# Patient Record
Sex: Female | Born: 1955 | Race: White | Hispanic: No | Smoking: Never smoker
Health system: Southern US, Community
[De-identification: ages and names within clinical notes are randomized; demographics above are authoritative.]

## PROBLEM LIST (undated history)

## (undated) DIAGNOSIS — I10 Essential (primary) hypertension: Secondary | ICD-10-CM

## (undated) DIAGNOSIS — E039 Hypothyroidism, unspecified: Secondary | ICD-10-CM

## (undated) DIAGNOSIS — E785 Hyperlipidemia, unspecified: Secondary | ICD-10-CM

## (undated) DIAGNOSIS — T753XXA Motion sickness, initial encounter: Secondary | ICD-10-CM

## (undated) HISTORY — PX: ANKLE SURGERY: SHX546

---

## 2020-10-21 ENCOUNTER — Other Ambulatory Visit: Payer: Self-pay | Admitting: Internal Medicine

## 2020-10-21 DIAGNOSIS — Z1231 Encounter for screening mammogram for malignant neoplasm of breast: Secondary | ICD-10-CM

## 2020-12-01 ENCOUNTER — Other Ambulatory Visit: Payer: Self-pay

## 2020-12-01 ENCOUNTER — Ambulatory Visit
Admission: RE | Admit: 2020-12-01 | Discharge: 2020-12-01 | Disposition: A | Discharge status: Home / Self Care | Payer: Medicare HMO | Source: Ambulatory Visit | Attending: Internal Medicine | Admitting: Internal Medicine

## 2020-12-01 DIAGNOSIS — Z1231 Encounter for screening mammogram for malignant neoplasm of breast: Secondary | ICD-10-CM | POA: Diagnosis present

## 2021-07-26 ENCOUNTER — Ambulatory Visit: Admit: 2021-07-26 | Payer: Medicare HMO | Admitting: Podiatry

## 2021-07-26 SURGERY — BUNIONECTOMY
Anesthesia: Choice | Site: Toe | Laterality: Left

## 2021-11-21 DIAGNOSIS — M2012 Hallux valgus (acquired), left foot: Secondary | ICD-10-CM | POA: Diagnosis not present

## 2021-11-21 DIAGNOSIS — M2042 Other hammer toe(s) (acquired), left foot: Secondary | ICD-10-CM | POA: Diagnosis not present

## 2021-12-20 ENCOUNTER — Encounter: Payer: Self-pay | Admitting: Podiatry

## 2021-12-26 ENCOUNTER — Other Ambulatory Visit: Payer: Self-pay | Admitting: Podiatry

## 2021-12-29 NOTE — Discharge Instructions (Signed)
[facility]  POST OPERATIVE INSTRUCTIONS FOR DR. FOWLER AND [facility]   Take your medication as prescribed.  Pain medication should be taken only as needed.  Keep the dressing clean, dry and intact.  Keep your foot elevated above the heart level for the first 48 hours.  Walking to the bathroom and brief periods of walking are acceptable, unless we have instructed you to be non-weight bearing.  Always wear your post-op shoe when walking.  Always use your crutches if you are to be non-weight bearing.  Do not take a shower. Baths are permissible as long as the foot is kept out of the water.   Every hour you are awake:  Bend your knee 15 times. Flex foot 15 times Massage calf 15 times  Call [facility] ([phone]) if any of the following problems occur: You develop a temperature or fever. The bandage becomes saturated with blood. Medication does not stop your pain. Injury of the foot occurs. Any symptoms of infection including redness, odor, or red streaks running from wound. 

## 2022-01-03 ENCOUNTER — Ambulatory Visit
Admission: RE | Admit: 2022-01-03 | Discharge: 2022-01-03 | Disposition: A | Discharge status: Home / Self Care | Payer: Medicare HMO | Attending: Podiatry | Admitting: Podiatry

## 2022-01-03 ENCOUNTER — Encounter
Admission: RE | Disposition: A | Discharge status: Home / Self Care | Payer: Self-pay | Source: Home / Self Care | Attending: Podiatry

## 2022-01-03 ENCOUNTER — Ambulatory Visit: Payer: Medicare HMO | Admitting: Anesthesiology

## 2022-01-03 ENCOUNTER — Other Ambulatory Visit: Payer: Self-pay

## 2022-01-03 ENCOUNTER — Ambulatory Visit: Payer: Self-pay

## 2022-01-03 ENCOUNTER — Encounter: Payer: Self-pay | Admitting: Podiatry

## 2022-01-03 DIAGNOSIS — E039 Hypothyroidism, unspecified: Secondary | ICD-10-CM | POA: Diagnosis not present

## 2022-01-03 DIAGNOSIS — M205X2 Other deformities of toe(s) (acquired), left foot: Secondary | ICD-10-CM | POA: Insufficient documentation

## 2022-01-03 DIAGNOSIS — M2042 Other hammer toe(s) (acquired), left foot: Secondary | ICD-10-CM | POA: Insufficient documentation

## 2022-01-03 DIAGNOSIS — M2012 Hallux valgus (acquired), left foot: Secondary | ICD-10-CM | POA: Diagnosis present

## 2022-01-03 DIAGNOSIS — I10 Essential (primary) hypertension: Secondary | ICD-10-CM | POA: Diagnosis not present

## 2022-01-03 HISTORY — DX: Hypothyroidism, unspecified: E03.9

## 2022-01-03 HISTORY — PX: BUNIONECTOMY: SHX129

## 2022-01-03 HISTORY — DX: Hyperlipidemia, unspecified: E78.5

## 2022-01-03 HISTORY — DX: Motion sickness, initial encounter: T75.3XXA

## 2022-01-03 HISTORY — PX: HAMMER TOE SURGERY: SHX385

## 2022-01-03 HISTORY — DX: Essential (primary) hypertension: I10

## 2022-01-03 SURGERY — BUNIONECTOMY
Anesthesia: General | Site: Toe | Laterality: Left

## 2022-01-03 MED ORDER — OXYCODONE HCL 5 MG PO TABS: 5.0000 mg | ORAL_TABLET | Freq: Once | ORAL | Status: DC | PRN

## 2022-01-03 MED ORDER — OXYCODONE-ACETAMINOPHEN 5-325 MG PO TABS: 1.0000 | ORAL_TABLET | Freq: Four times a day (QID) | ORAL | 0 refills | Status: AC | PRN

## 2022-01-03 MED ORDER — DEXAMETHASONE SODIUM PHOSPHATE 4 MG/ML IJ SOLN
INTRAMUSCULAR | Status: DC | PRN
  Administered 2022-01-03: 4 mg via INTRAVENOUS

## 2022-01-03 MED ORDER — ACETAMINOPHEN 160 MG/5ML PO SOLN: 325.0000 mg | ORAL | Status: DC | PRN

## 2022-01-03 MED ORDER — MIDAZOLAM HCL 5 MG/5ML IJ SOLN
INTRAMUSCULAR | Status: DC | PRN
  Administered 2022-01-03: 1 mg via INTRAVENOUS

## 2022-01-03 MED ORDER — LIDOCAINE HCL (CARDIAC) PF 100 MG/5ML IV SOSY
PREFILLED_SYRINGE | INTRAVENOUS | Status: DC | PRN
  Administered 2022-01-03: 40 mg via INTRATRACHEAL

## 2022-01-03 MED ORDER — ONDANSETRON HCL 4 MG/2ML IJ SOLN
4.0000 mg | Freq: Once | INTRAMUSCULAR | Status: AC
  Administered 2022-01-03: 4 mg via INTRAVENOUS

## 2022-01-03 MED ORDER — ONDANSETRON HCL 4 MG/2ML IJ SOLN
INTRAMUSCULAR | Status: DC | PRN
  Administered 2022-01-03: 4 mg via INTRAVENOUS

## 2022-01-03 MED ORDER — ACETAMINOPHEN 325 MG PO TABS: 325.0000 mg | ORAL_TABLET | ORAL | Status: DC | PRN

## 2022-01-03 MED ORDER — SCOPOLAMINE 1 MG/3DAYS TD PT72
1.0000 | MEDICATED_PATCH | TRANSDERMAL | Status: DC
  Administered 2022-01-03: 1.5 mg via TRANSDERMAL

## 2022-01-03 MED ORDER — EPHEDRINE SULFATE 50 MG/ML IJ SOLN
INTRAMUSCULAR | Status: DC | PRN
  Administered 2022-01-03 (×2): 5 mg via INTRAVENOUS
  Administered 2022-01-03: 10 mg via INTRAVENOUS
  Administered 2022-01-03 (×2): 5 mg via INTRAVENOUS

## 2022-01-03 MED ORDER — ONDANSETRON HCL 4 MG PO TABS: 4.0000 mg | ORAL_TABLET | Freq: Every day | ORAL | 1 refills | Status: AC | PRN

## 2022-01-03 MED ORDER — LACTATED RINGERS IV SOLN: INTRAVENOUS | Status: DC

## 2022-01-03 MED ORDER — PROPOFOL 10 MG/ML IV BOLUS
INTRAVENOUS | Status: DC | PRN
  Administered 2022-01-03: 110 mg via INTRAVENOUS

## 2022-01-03 MED ORDER — BUPIVACAINE HCL (PF) 0.25 % IJ SOLN
INTRAMUSCULAR | Status: DC | PRN
  Administered 2022-01-03: 10 mL

## 2022-01-03 MED ORDER — FENTANYL CITRATE (PF) 100 MCG/2ML IJ SOLN
INTRAMUSCULAR | Status: DC | PRN
Start: 2022-01-03 — End: 2022-01-03
  Administered 2022-01-03: 50 ug via INTRAVENOUS

## 2022-01-03 MED ORDER — CEFAZOLIN SODIUM-DEXTROSE 2-4 GM/100ML-% IV SOLN
2.0000 g | INTRAVENOUS | Status: AC
  Administered 2022-01-03: 2 g via INTRAVENOUS

## 2022-01-03 MED ORDER — SODIUM CHLORIDE 0.9 % IR SOLN
Status: DC | PRN
  Administered 2022-01-03: 500 mL

## 2022-01-03 MED ORDER — BUPIVACAINE LIPOSOME 1.3 % IJ SUSP
INTRAMUSCULAR | Status: DC | PRN
  Administered 2022-01-03: 10 mL

## 2022-01-03 MED ORDER — OXYCODONE HCL 5 MG/5ML PO SOLN: 5.0000 mg | Freq: Once | ORAL | Status: DC | PRN

## 2022-01-03 MED ORDER — FENTANYL CITRATE PF 50 MCG/ML IJ SOSY: 25.0000 ug | PREFILLED_SYRINGE | INTRAMUSCULAR | Status: DC | PRN

## 2022-01-03 MED ORDER — GLYCOPYRROLATE 0.2 MG/ML IJ SOLN
INTRAMUSCULAR | Status: DC | PRN
  Administered 2022-01-03: .1 mg via INTRAVENOUS

## 2022-01-03 SURGICAL SUPPLY — 49 items
APL SKNCLS STERI-STRIP NONHPOA (GAUZE/BANDAGES/DRESSINGS) ×2
BENZOIN TINCTURE PRP APPL 2/3 (GAUZE/BANDAGES/DRESSINGS) ×3 IMPLANT
BLADE OSC/SAGITTAL MD 5.5X18 (BLADE) ×2 IMPLANT
BLADE SAW LAPIPLASTY 40X11 (BLADE) ×2 IMPLANT
BNDG CMPR 75X41 PLY HI ABS (GAUZE/BANDAGES/DRESSINGS) ×2
BNDG COHESIVE 4X5 TAN ST LF (GAUZE/BANDAGES/DRESSINGS) ×3 IMPLANT
BNDG ELASTIC 4X5.8 VLCR STR LF (GAUZE/BANDAGES/DRESSINGS) ×3 IMPLANT
BNDG ESMARK 4X12 TAN STRL LF (GAUZE/BANDAGES/DRESSINGS) ×3 IMPLANT
BNDG GAUZE ELAST 4 BULKY (GAUZE/BANDAGES/DRESSINGS) ×3 IMPLANT
BNDG STRETCH 4X75 STRL LF (GAUZE/BANDAGES/DRESSINGS) ×3 IMPLANT
BOOT STEPPER DURA SM (SOFTGOODS) ×2 IMPLANT
CANISTER SUCT 1200ML W/VALVE (MISCELLANEOUS) ×3 IMPLANT
COVER LIGHT HANDLE UNIVERSAL (MISCELLANEOUS) ×6 IMPLANT
CUFF TOURN SGL QUICK 18X4 (TOURNIQUET CUFF) ×2 IMPLANT
DRAPE FLUOR MINI C-ARM 54X84 (DRAPES) ×3 IMPLANT
DURAPREP 26ML APPLICATOR (WOUND CARE) ×3 IMPLANT
ELECT REM PT RETURN 9FT ADLT (ELECTROSURGICAL) ×3
ELECTRODE REM PT RTRN 9FT ADLT (ELECTROSURGICAL) ×2 IMPLANT
FIXATION HAMMERTOE MED 20MM 0D (Toe) ×1 IMPLANT
GAUZE SPONGE 4X4 12PLY STRL (GAUZE/BANDAGES/DRESSINGS) ×3 IMPLANT
GAUZE XEROFORM 1X8 LF (GAUZE/BANDAGES/DRESSINGS) ×3 IMPLANT
GLOVE SRG 8 PF TXTR STRL LF DI (GLOVE) ×4 IMPLANT
GLOVE SURG ENC MOIS LTX SZ7.5 (GLOVE) ×6 IMPLANT
GLOVE SURG UNDER POLY LF SZ8 (GLOVE) ×6
GOWN STRL REUS W/ TWL LRG LVL3 (GOWN DISPOSABLE) ×4 IMPLANT
GOWN STRL REUS W/TWL LRG LVL3 (GOWN DISPOSABLE) ×6
HAMMERTOE MED 20MM 0D (Toe) ×2 IMPLANT
HAMMERTOE MEDIUM 20MM (Toe) ×1 IMPLANT
KIT DRILL HAMMERLOCK2 IMPLANT (BIT) ×2 IMPLANT
KIT TURNOVER KIT A (KITS) ×3 IMPLANT
LAPIPLASTY SYS 4A (Orthopedic Implant) ×3 IMPLANT
NS IRRIG 500ML POUR BTL (IV SOLUTION) ×3 IMPLANT
PACK EXTREMITY ARMC (MISCELLANEOUS) ×3 IMPLANT
PENCIL SMOKE EVACUATOR (MISCELLANEOUS) ×3 IMPLANT
PIN BALLS 3/8 F/.045 WIRE (MISCELLANEOUS) ×3 IMPLANT
RASP SM TEAR CROSS CUT (RASP) ×2 IMPLANT
SCREW 2.7 HIGH PITCH LOCKING (Screw) ×2 IMPLANT
SCREW HIGH PITCH LOCK 2.7 (Screw) ×2 IMPLANT
STOCKINETTE IMPERVIOUS LG (DRAPES) ×3 IMPLANT
STRIP CLOSURE SKIN 1/4X4 (GAUZE/BANDAGES/DRESSINGS) ×3 IMPLANT
SUT ETHILON 3-0 FS-10 30 BLK (SUTURE) ×3
SUT MNCRL 4-0 (SUTURE) ×3
SUT MNCRL 4-0 27XMFL (SUTURE) ×2
SUT VIC AB 3-0 SH 27 (SUTURE) ×3
SUT VIC AB 3-0 SH 27X BRD (SUTURE) ×1 IMPLANT
SUT VIC AB 4-0 FS2 27 (SUTURE) ×2 IMPLANT
SUTURE EHLN 3-0 FS-10 30 BLK (SUTURE) ×1 IMPLANT
SUTURE MNCRL 4-0 27XMF (SUTURE) ×1 IMPLANT
SYSTEM LAPIPLASTY 4A (Orthopedic Implant) ×1 IMPLANT

## 2022-01-03 NOTE — H&P (Signed)
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  The various methods of treatment have been discussed with the patient.  No guarantees were given.  After consideration of risks, benefits and other options for treatment, the patient has consented to surgery.  I have reviewed the patients chart and labs.     A history and physical examination was performed in my office.  The patient was reexamined.  There have been no changes to this history and physical examination.  Full H & P performed in pre op area.  Samara Deist A

## 2022-01-03 NOTE — Anesthesia Procedure Notes (Signed)
Procedure Name: LMA Insertion Date/Time: 01/03/2022 11:13 AM Performed by: Mayme Genta, CRNA Pre-anesthesia Checklist: Patient identified, Emergency Drugs available, Suction available, Timeout performed and Patient being monitored Patient Re-evaluated:Patient Re-evaluated prior to induction Oxygen Delivery Method: Circle system utilized Preoxygenation: Pre-oxygenation with 100% oxygen Induction Type: IV induction LMA: LMA inserted LMA Size: 4.0 Number of attempts: 1 Placement Confirmation: positive ETCO2 and breath sounds checked- equal and bilateral Tube secured with: Tape

## 2022-01-03 NOTE — Anesthesia Preprocedure Evaluation (Signed)
Anesthesia Evaluation  Patient identified by MRN, date of birth, ID band Patient awake    History of Anesthesia Complications (+) PONVNegative for: history of anesthetic complications  Airway Mallampati: II  TM Distance: >3 FB Neck ROM: Full    Dental no notable dental hx.    Pulmonary neg pulmonary ROS,    Pulmonary exam normal        Cardiovascular Exercise Tolerance: Good hypertension, Pt. on medications Normal cardiovascular exam     Neuro/Psych negative neurological ROS  negative psych ROS   GI/Hepatic negative GI ROS, Neg liver ROS,   Endo/Other  Hypothyroidism   Renal/GU negative Renal ROS     Musculoskeletal negative musculoskeletal ROS (+)   Abdominal   Peds  Hematology negative hematology ROS (+)   Anesthesia Other Findings   Reproductive/Obstetrics                             Anesthesia Physical Anesthesia Plan  ASA: 2  Anesthesia Plan: General   Post-op Pain Management: Toradol IV (intra-op) and Ofirmev IV (intra-op)   Induction:   PONV Risk Score and Plan: 4 or greater and Ondansetron, Dexamethasone, Midazolam, Treatment may vary due to age or medical condition and Scopolamine patch - Pre-op  Airway Management Planned: LMA  Additional Equipment: None  Intra-op Plan:   Post-operative Plan: Extubation in OR  Informed Consent: I have reviewed the patients History and Physical, chart, labs and discussed the procedure including the risks, benefits and alternatives for the proposed anesthesia with the patient or authorized representative who has indicated his/her understanding and acceptance.       Plan Discussed with: CRNA  Anesthesia Plan Comments:         Anesthesia Quick Evaluation

## 2022-01-03 NOTE — Transfer of Care (Signed)
Immediate Anesthesia Transfer of Care Note  Patient: Jillian Silva  Procedure(s) Performed: BUNIONECTOMY - LAPIDUS TYPE (Left: Toe) HAMMER TOE CORRECTION - 2ND (Left: Toe)  Patient Location: PACU  Anesthesia Type: General  Level of Consciousness: awake, alert  and patient cooperative  Airway and Oxygen Therapy: Patient Spontanous Breathing and Patient connected to supplemental oxygen  Post-op Assessment: Post-op Vital signs reviewed, Patient's Cardiovascular Status Stable, Respiratory Function Stable, Patent Airway and No signs of Nausea or vomiting  Post-op Vital Signs: Reviewed and stable  Complications: No notable events documented.

## 2022-01-03 NOTE — Anesthesia Postprocedure Evaluation (Signed)
Anesthesia Post Note  Patient: Jillian Silva  Procedure(s) Performed: Lillard Anes - LAPIDUS TYPE (Left: Toe) HAMMER TOE CORRECTION - 2ND (Left: Toe)     Patient location during evaluation: PACU Anesthesia Type: General Level of consciousness: awake and alert Pain management: pain level controlled Vital Signs Assessment: post-procedure vital signs reviewed and stable Respiratory status: spontaneous breathing, nonlabored ventilation, respiratory function stable and patient connected to nasal cannula oxygen Cardiovascular status: blood pressure returned to baseline and stable Postop Assessment: no apparent nausea or vomiting Anesthetic complications: no   No notable events documented.  Adele Barthel Denni France

## 2022-01-03 NOTE — Op Note (Signed)
Operative note   Surgeon:Justin Lawyer: None    Preop diagnosis: 1)Hallux valgus deformity left foot 2) hammertoe contracture left second toe    Postop diagnosis: Same    Procedure: Lapidus hallux valgus correction left foot.  2) hammertoe repair left second toe with PIPJ arthrodesis with hammerlock implant  3) dorsal and medial capsulotomy with plantar plate release second MTPJ left foot    EBL: Minimal    Anesthesia:local and general.  Local consisted of a total of 20 cc of spare long-acting anesthetic and 10 cc of 0.25% bupivacaine plain    Hemostasis: Mid calf tourniquet inflated to 200 mmHg for 90-minute    Specimen: None    Complications: None    Operative indications:Jillian Silva is an 66 y.o. that presents today for surgical intervention.  The risks/benefits/alternatives/complications have been discussed and consent has been given.    Procedure:  Patient was brought into the OR and placed on the operating table in thesupine position. After anesthesia was obtained theleft lower extremity was prepped and draped in usual sterile fashion.  Attention was directed to the dorsal aspect of the foot where a dorsal incision was made at the first met cuneiforms joint.  Sharp and blunt dissection was carried down to the periosteum.  Subperiosteal dissection was then undertaken.  This exposed the first met cuneiform joint.  This was then freed and loosened.  A small fulcrum was placed between the base of the first metatarsal and second metatarsal.  Next the joint positioner was placed on the medial aspect of the metatarsal.  A small stab incision was made at the second metatarsal.  Compression was placed for the positioner.  Good realignment of the first intermetatarsal angle was noted at this time.  Attention was then directed to the dorsomedial first MTPJ where an incision was performed.  Sharp and blunt dissection was carried down to the capsule.  The intermetatarsal space was  then entered.  The conjoined tendon of the abductor was then freed from the base of the proximal phalanx.  Attention was to redirected to the first met cuneiform joint.  At this time the osteotomy cut guide was placed into the joint region.  2 vertical cuts were then made.  The cartilage material was removed from the first met cuneiform joint and the joint was then prepped with a 2.0 mm drill bit.  The joint compressor was then placed.  Good compression and realignment was noted.  Next the medial and dorsal locking plates were then placed from the Conway set.  Realignment and stability was noted in all planes.  Attention was redirected to the dorsomedial first MTPJ.  A small T capsulotomy was performed.  The dorsomedial eminence was noted and transected and smoothed with a power rasp.  Closure was then performed after all areas were irrigated.  3-0 Vicryl for the capsular tissue.  4-0 Vicryl in subcutaneous tissue and a 4-0 Monocryl for the skin.   Attention was then directed to the second toe where a curvilinear incision was made from the second metatarsophalangeal joint to the PIPJ.  Sharp and blunt dissection carried down to the longus tensor tendon.  The extensor tendon was noted transected and reflected proximal.  At this time residual contracture of the MTPJ was noted.  A dorsal and medial capsulotomy was performed.  McGlamry elevator was introduced in the plantar plate release.  At this time the toe did note to sit in a much better straighter position.  The  head of the proximal phalanx was then excised as well as the base of the middle phalanx.  A medium hammerlock straight implant was then placed in the PIPJ for fusion.  Good alignment was noted.  The wound was then flushed with copious amounts of irrigation.  Closure of the extensor tendon was performed with a 4-0 Vicryl as well as the subcutaneous tissue and the skin was closed with a 3-0 nylon.  Further local anesthesia was performed at the  end of the case.    Patient tolerated the procedure and anesthesia well.  Was transported from the OR to the PACU with all vital signs stable and vascular status intact. To be discharged per routine protocol.  Will follow up in approximately 1 week in the outpatient clinic.

## 2022-01-04 ENCOUNTER — Encounter: Payer: Self-pay | Admitting: Podiatry

## 2022-01-26 ENCOUNTER — Other Ambulatory Visit: Payer: Self-pay | Admitting: Internal Medicine

## 2022-01-26 DIAGNOSIS — Z1231 Encounter for screening mammogram for malignant neoplasm of breast: Secondary | ICD-10-CM

## 2022-03-05 ENCOUNTER — Ambulatory Visit
Admission: RE | Admit: 2022-03-05 | Discharge: 2022-03-05 | Disposition: A | Discharge status: Home / Self Care | Payer: Medicare HMO | Source: Ambulatory Visit | Attending: Internal Medicine | Admitting: Internal Medicine

## 2022-03-05 ENCOUNTER — Other Ambulatory Visit: Payer: Self-pay

## 2022-03-05 DIAGNOSIS — Z1231 Encounter for screening mammogram for malignant neoplasm of breast: Secondary | ICD-10-CM | POA: Insufficient documentation

## 2022-04-12 DIAGNOSIS — M2012 Hallux valgus (acquired), left foot: Secondary | ICD-10-CM | POA: Diagnosis not present

## 2022-04-12 DIAGNOSIS — M2042 Other hammer toe(s) (acquired), left foot: Secondary | ICD-10-CM | POA: Diagnosis not present

## 2022-04-25 DIAGNOSIS — L570 Actinic keratosis: Secondary | ICD-10-CM | POA: Diagnosis not present

## 2022-04-25 DIAGNOSIS — L821 Other seborrheic keratosis: Secondary | ICD-10-CM | POA: Diagnosis not present

## 2022-04-25 DIAGNOSIS — L298 Other pruritus: Secondary | ICD-10-CM | POA: Diagnosis not present

## 2022-06-11 DIAGNOSIS — H35341 Macular cyst, hole, or pseudohole, right eye: Secondary | ICD-10-CM | POA: Diagnosis not present

## 2022-06-11 DIAGNOSIS — H5203 Hypermetropia, bilateral: Secondary | ICD-10-CM | POA: Diagnosis not present

## 2022-06-11 DIAGNOSIS — H524 Presbyopia: Secondary | ICD-10-CM | POA: Diagnosis not present

## 2022-06-11 DIAGNOSIS — H52213 Irregular astigmatism, bilateral: Secondary | ICD-10-CM | POA: Diagnosis not present

## 2022-07-19 DIAGNOSIS — L570 Actinic keratosis: Secondary | ICD-10-CM | POA: Diagnosis not present

## 2022-07-19 DIAGNOSIS — Z872 Personal history of diseases of the skin and subcutaneous tissue: Secondary | ICD-10-CM | POA: Diagnosis not present

## 2022-07-19 DIAGNOSIS — L578 Other skin changes due to chronic exposure to nonionizing radiation: Secondary | ICD-10-CM | POA: Diagnosis not present

## 2022-07-19 DIAGNOSIS — L821 Other seborrheic keratosis: Secondary | ICD-10-CM | POA: Diagnosis not present

## 2022-07-19 DIAGNOSIS — L298 Other pruritus: Secondary | ICD-10-CM | POA: Diagnosis not present

## 2022-08-13 DIAGNOSIS — L570 Actinic keratosis: Secondary | ICD-10-CM | POA: Diagnosis not present

## 2022-08-13 DIAGNOSIS — L821 Other seborrheic keratosis: Secondary | ICD-10-CM | POA: Diagnosis not present

## 2022-09-04 DIAGNOSIS — D485 Neoplasm of uncertain behavior of skin: Secondary | ICD-10-CM | POA: Diagnosis not present

## 2022-09-04 DIAGNOSIS — C44722 Squamous cell carcinoma of skin of right lower limb, including hip: Secondary | ICD-10-CM | POA: Diagnosis not present

## 2022-09-19 DIAGNOSIS — I10 Essential (primary) hypertension: Secondary | ICD-10-CM | POA: Diagnosis not present

## 2022-09-19 DIAGNOSIS — F411 Generalized anxiety disorder: Secondary | ICD-10-CM | POA: Diagnosis not present

## 2022-09-19 DIAGNOSIS — E7800 Pure hypercholesterolemia, unspecified: Secondary | ICD-10-CM | POA: Diagnosis not present

## 2022-09-19 DIAGNOSIS — E890 Postprocedural hypothyroidism: Secondary | ICD-10-CM | POA: Diagnosis not present

## 2022-10-01 DIAGNOSIS — C44722 Squamous cell carcinoma of skin of right lower limb, including hip: Secondary | ICD-10-CM | POA: Diagnosis not present

## 2022-10-01 DIAGNOSIS — L309 Dermatitis, unspecified: Secondary | ICD-10-CM | POA: Diagnosis not present

## 2022-10-18 DIAGNOSIS — R059 Cough, unspecified: Secondary | ICD-10-CM | POA: Diagnosis not present

## 2022-10-18 DIAGNOSIS — R070 Pain in throat: Secondary | ICD-10-CM | POA: Diagnosis not present

## 2022-10-18 DIAGNOSIS — Z20822 Contact with and (suspected) exposure to covid-19: Secondary | ICD-10-CM | POA: Diagnosis not present

## 2022-10-18 DIAGNOSIS — J069 Acute upper respiratory infection, unspecified: Secondary | ICD-10-CM | POA: Diagnosis not present

## 2022-10-29 DIAGNOSIS — I10 Essential (primary) hypertension: Secondary | ICD-10-CM | POA: Diagnosis not present

## 2022-10-29 DIAGNOSIS — E890 Postprocedural hypothyroidism: Secondary | ICD-10-CM | POA: Diagnosis not present

## 2022-10-29 DIAGNOSIS — E7800 Pure hypercholesterolemia, unspecified: Secondary | ICD-10-CM | POA: Diagnosis not present

## 2022-10-30 DIAGNOSIS — M2011 Hallux valgus (acquired), right foot: Secondary | ICD-10-CM | POA: Diagnosis not present

## 2022-10-30 DIAGNOSIS — M79671 Pain in right foot: Secondary | ICD-10-CM | POA: Diagnosis not present

## 2022-11-01 DIAGNOSIS — L03032 Cellulitis of left toe: Secondary | ICD-10-CM | POA: Diagnosis not present

## 2022-11-05 DIAGNOSIS — E7800 Pure hypercholesterolemia, unspecified: Secondary | ICD-10-CM | POA: Diagnosis not present

## 2022-11-05 DIAGNOSIS — E039 Hypothyroidism, unspecified: Secondary | ICD-10-CM | POA: Diagnosis not present

## 2022-11-05 DIAGNOSIS — I10 Essential (primary) hypertension: Secondary | ICD-10-CM | POA: Diagnosis not present

## 2022-11-05 DIAGNOSIS — Z0000 Encounter for general adult medical examination without abnormal findings: Secondary | ICD-10-CM | POA: Diagnosis not present

## 2022-11-05 DIAGNOSIS — Z2821 Immunization not carried out because of patient refusal: Secondary | ICD-10-CM | POA: Diagnosis not present

## 2022-11-05 DIAGNOSIS — Z1331 Encounter for screening for depression: Secondary | ICD-10-CM | POA: Diagnosis not present

## 2022-11-15 DIAGNOSIS — L03032 Cellulitis of left toe: Secondary | ICD-10-CM | POA: Diagnosis not present

## 2022-11-19 DIAGNOSIS — C4441 Basal cell carcinoma of skin of scalp and neck: Secondary | ICD-10-CM | POA: Diagnosis not present

## 2022-11-19 DIAGNOSIS — L570 Actinic keratosis: Secondary | ICD-10-CM | POA: Diagnosis not present

## 2022-11-19 DIAGNOSIS — D485 Neoplasm of uncertain behavior of skin: Secondary | ICD-10-CM | POA: Diagnosis not present

## 2022-12-31 DIAGNOSIS — C4491 Basal cell carcinoma of skin, unspecified: Secondary | ICD-10-CM | POA: Diagnosis not present

## 2022-12-31 DIAGNOSIS — C44722 Squamous cell carcinoma of skin of right lower limb, including hip: Secondary | ICD-10-CM | POA: Diagnosis not present

## 2022-12-31 DIAGNOSIS — L988 Other specified disorders of the skin and subcutaneous tissue: Secondary | ICD-10-CM | POA: Diagnosis not present

## 2023-01-07 DIAGNOSIS — Z1211 Encounter for screening for malignant neoplasm of colon: Secondary | ICD-10-CM | POA: Diagnosis not present

## 2023-01-07 DIAGNOSIS — K640 First degree hemorrhoids: Secondary | ICD-10-CM | POA: Diagnosis not present

## 2023-01-14 DIAGNOSIS — L905 Scar conditions and fibrosis of skin: Secondary | ICD-10-CM | POA: Diagnosis not present

## 2023-01-14 DIAGNOSIS — L570 Actinic keratosis: Secondary | ICD-10-CM | POA: Diagnosis not present

## 2023-01-22 ENCOUNTER — Other Ambulatory Visit: Payer: Self-pay | Admitting: Family Medicine

## 2023-01-22 ENCOUNTER — Ambulatory Visit
Admission: RE | Admit: 2023-01-22 | Discharge: 2023-01-22 | Disposition: A | Discharge status: Home / Self Care | Payer: Medicare HMO | Source: Ambulatory Visit | Attending: Family Medicine | Admitting: Family Medicine

## 2023-01-22 DIAGNOSIS — S6992XA Unspecified injury of left wrist, hand and finger(s), initial encounter: Secondary | ICD-10-CM | POA: Diagnosis not present

## 2023-01-22 DIAGNOSIS — Z043 Encounter for examination and observation following other accident: Secondary | ICD-10-CM | POA: Insufficient documentation

## 2023-01-22 DIAGNOSIS — S0990XA Unspecified injury of head, initial encounter: Secondary | ICD-10-CM

## 2023-01-22 DIAGNOSIS — W19XXXA Unspecified fall, initial encounter: Secondary | ICD-10-CM

## 2023-01-22 DIAGNOSIS — S0083XA Contusion of other part of head, initial encounter: Secondary | ICD-10-CM | POA: Diagnosis not present

## 2023-01-22 DIAGNOSIS — W01198A Fall on same level from slipping, tripping and stumbling with subsequent striking against other object, initial encounter: Secondary | ICD-10-CM | POA: Diagnosis not present

## 2023-01-22 DIAGNOSIS — I6782 Cerebral ischemia: Secondary | ICD-10-CM | POA: Diagnosis not present

## 2023-01-22 DIAGNOSIS — T1490XA Injury, unspecified, initial encounter: Secondary | ICD-10-CM | POA: Diagnosis not present

## 2023-03-26 ENCOUNTER — Other Ambulatory Visit: Payer: Self-pay | Admitting: Internal Medicine

## 2023-03-26 DIAGNOSIS — Z1231 Encounter for screening mammogram for malignant neoplasm of breast: Secondary | ICD-10-CM

## 2023-04-02 DIAGNOSIS — Z859 Personal history of malignant neoplasm, unspecified: Secondary | ICD-10-CM | POA: Diagnosis not present

## 2023-04-02 DIAGNOSIS — L578 Other skin changes due to chronic exposure to nonionizing radiation: Secondary | ICD-10-CM | POA: Diagnosis not present

## 2023-04-02 DIAGNOSIS — Z872 Personal history of diseases of the skin and subcutaneous tissue: Secondary | ICD-10-CM | POA: Diagnosis not present

## 2023-04-02 DIAGNOSIS — L298 Other pruritus: Secondary | ICD-10-CM | POA: Diagnosis not present

## 2023-04-02 DIAGNOSIS — Z85828 Personal history of other malignant neoplasm of skin: Secondary | ICD-10-CM | POA: Diagnosis not present

## 2023-04-02 DIAGNOSIS — L570 Actinic keratosis: Secondary | ICD-10-CM | POA: Diagnosis not present

## 2023-04-09 DIAGNOSIS — J40 Bronchitis, not specified as acute or chronic: Secondary | ICD-10-CM | POA: Diagnosis not present

## 2023-04-29 ENCOUNTER — Ambulatory Visit
Admission: RE | Admit: 2023-04-29 | Discharge: 2023-04-29 | Disposition: A | Discharge status: Home / Self Care | Payer: Medicare HMO | Source: Ambulatory Visit | Attending: Internal Medicine | Admitting: Internal Medicine

## 2023-04-29 DIAGNOSIS — Z1231 Encounter for screening mammogram for malignant neoplasm of breast: Secondary | ICD-10-CM | POA: Insufficient documentation

## 2023-05-01 ENCOUNTER — Other Ambulatory Visit: Payer: Self-pay | Admitting: Internal Medicine

## 2023-05-01 DIAGNOSIS — N6489 Other specified disorders of breast: Secondary | ICD-10-CM

## 2023-05-01 DIAGNOSIS — R928 Other abnormal and inconclusive findings on diagnostic imaging of breast: Secondary | ICD-10-CM

## 2023-05-02 ENCOUNTER — Ambulatory Visit
Admission: RE | Admit: 2023-05-02 | Discharge: 2023-05-02 | Disposition: A | Discharge status: Home / Self Care | Payer: Medicare HMO | Source: Ambulatory Visit | Attending: Internal Medicine | Admitting: Internal Medicine

## 2023-05-02 DIAGNOSIS — R928 Other abnormal and inconclusive findings on diagnostic imaging of breast: Secondary | ICD-10-CM

## 2023-05-02 DIAGNOSIS — N6489 Other specified disorders of breast: Secondary | ICD-10-CM | POA: Diagnosis not present

## 2023-05-02 DIAGNOSIS — R92322 Mammographic fibroglandular density, left breast: Secondary | ICD-10-CM | POA: Diagnosis not present

## 2023-07-05 DIAGNOSIS — L570 Actinic keratosis: Secondary | ICD-10-CM | POA: Diagnosis not present

## 2023-07-05 DIAGNOSIS — L821 Other seborrheic keratosis: Secondary | ICD-10-CM | POA: Diagnosis not present

## 2023-10-02 DIAGNOSIS — Z859 Personal history of malignant neoplasm, unspecified: Secondary | ICD-10-CM | POA: Diagnosis not present

## 2023-10-02 DIAGNOSIS — D485 Neoplasm of uncertain behavior of skin: Secondary | ICD-10-CM | POA: Diagnosis not present

## 2023-10-02 DIAGNOSIS — L578 Other skin changes due to chronic exposure to nonionizing radiation: Secondary | ICD-10-CM | POA: Diagnosis not present

## 2023-10-02 DIAGNOSIS — Z85828 Personal history of other malignant neoplasm of skin: Secondary | ICD-10-CM | POA: Diagnosis not present

## 2023-10-02 DIAGNOSIS — D2271 Melanocytic nevi of right lower limb, including hip: Secondary | ICD-10-CM | POA: Diagnosis not present

## 2023-10-02 DIAGNOSIS — Z872 Personal history of diseases of the skin and subcutaneous tissue: Secondary | ICD-10-CM | POA: Diagnosis not present

## 2023-10-02 DIAGNOSIS — L570 Actinic keratosis: Secondary | ICD-10-CM | POA: Diagnosis not present

## 2023-10-31 DIAGNOSIS — E039 Hypothyroidism, unspecified: Secondary | ICD-10-CM | POA: Diagnosis not present

## 2023-10-31 DIAGNOSIS — E7800 Pure hypercholesterolemia, unspecified: Secondary | ICD-10-CM | POA: Diagnosis not present

## 2023-10-31 DIAGNOSIS — R7309 Other abnormal glucose: Secondary | ICD-10-CM | POA: Diagnosis not present

## 2023-10-31 DIAGNOSIS — I10 Essential (primary) hypertension: Secondary | ICD-10-CM | POA: Diagnosis not present

## 2023-11-11 DIAGNOSIS — D2371 Other benign neoplasm of skin of right lower limb, including hip: Secondary | ICD-10-CM | POA: Diagnosis not present

## 2023-11-11 DIAGNOSIS — D2271 Melanocytic nevi of right lower limb, including hip: Secondary | ICD-10-CM | POA: Diagnosis not present

## 2023-11-26 DIAGNOSIS — Z2821 Immunization not carried out because of patient refusal: Secondary | ICD-10-CM | POA: Diagnosis not present

## 2023-11-26 DIAGNOSIS — E890 Postprocedural hypothyroidism: Secondary | ICD-10-CM | POA: Diagnosis not present

## 2023-11-26 DIAGNOSIS — Z0000 Encounter for general adult medical examination without abnormal findings: Secondary | ICD-10-CM | POA: Diagnosis not present

## 2023-11-26 DIAGNOSIS — Z23 Encounter for immunization: Secondary | ICD-10-CM | POA: Diagnosis not present

## 2023-11-26 DIAGNOSIS — Z1331 Encounter for screening for depression: Secondary | ICD-10-CM | POA: Diagnosis not present

## 2023-11-26 DIAGNOSIS — I10 Essential (primary) hypertension: Secondary | ICD-10-CM | POA: Diagnosis not present

## 2024-02-26 IMAGING — MG MM DIGITAL SCREENING BILAT W/ TOMO AND CAD
6 of 10 series · 6 of 30 positions shown · non-contrast
Comparison: Previous exam(s).

CLINICAL DATA: Screening.

EXAM:
DIGITAL SCREENING BILATERAL MAMMOGRAM WITH TOMOSYNTHESIS AND CAD
TECHNIQUE: Bilateral screening digital craniocaudal and mediolateral oblique
mammograms were obtained. Bilateral screening digital breast
tomosynthesis was performed. The images were evaluated with
computer-aided detection.

[R MLO synth-2D]
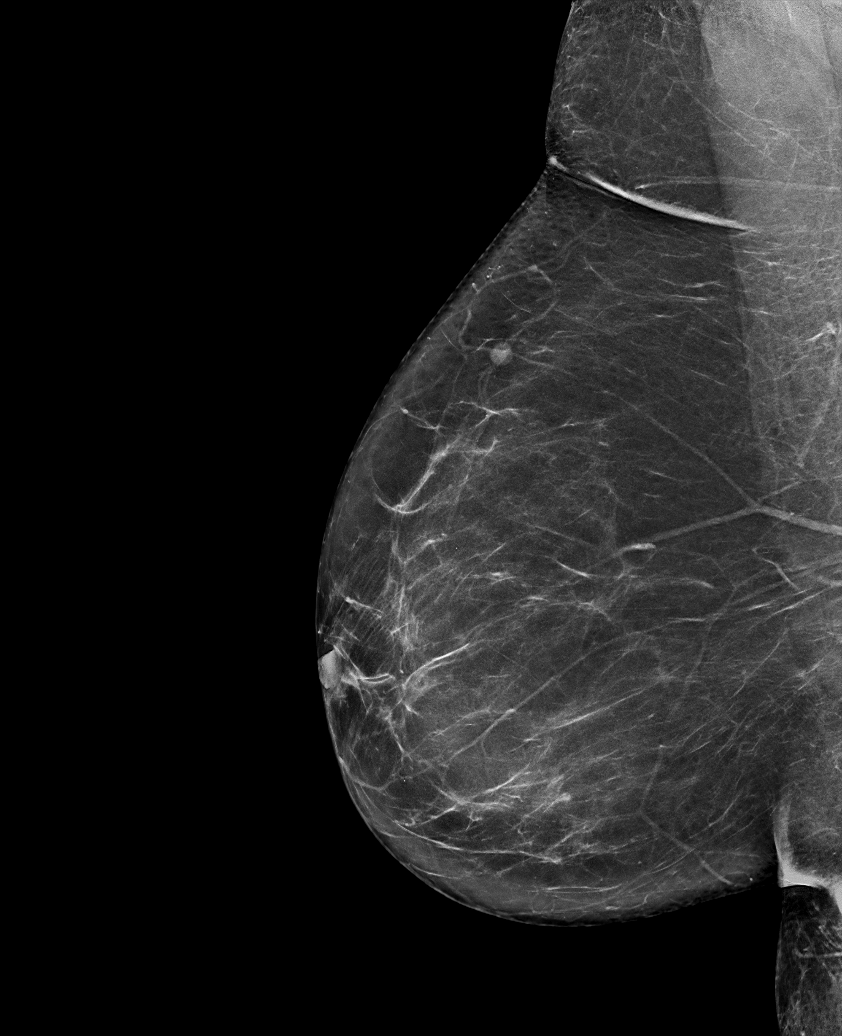

[R CC synth-2D (1 of 2)]
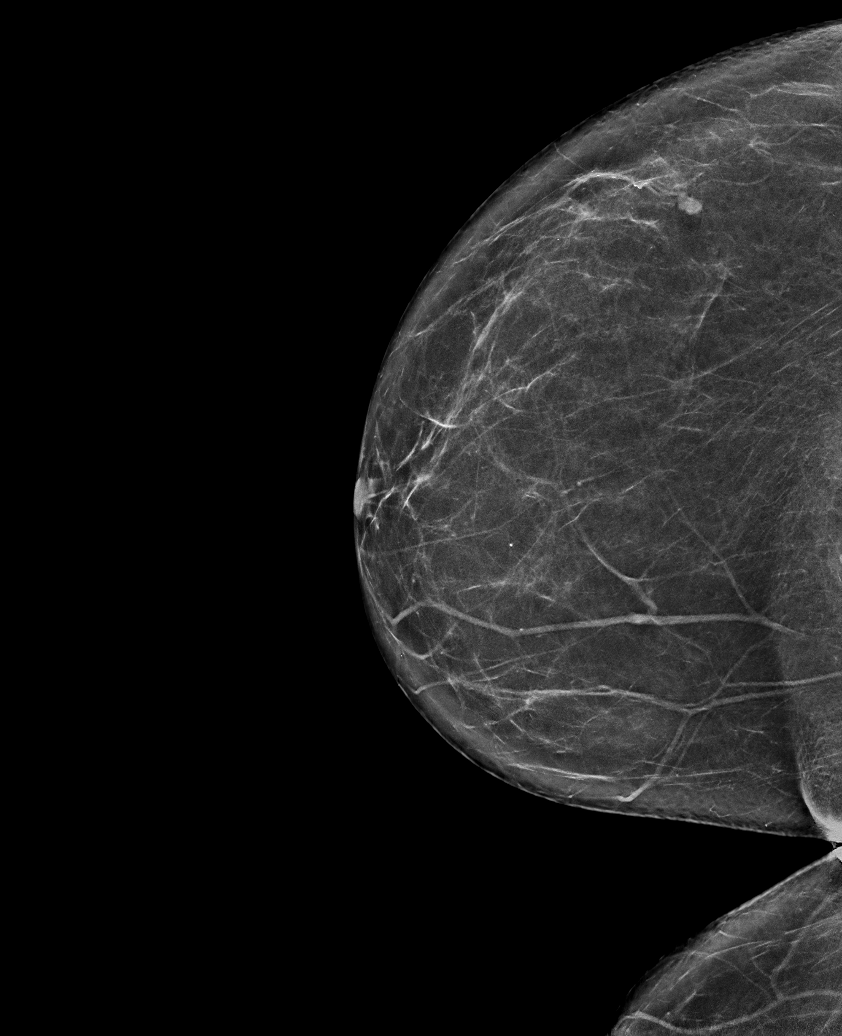

[R CC synth-2D (2 of 2)]
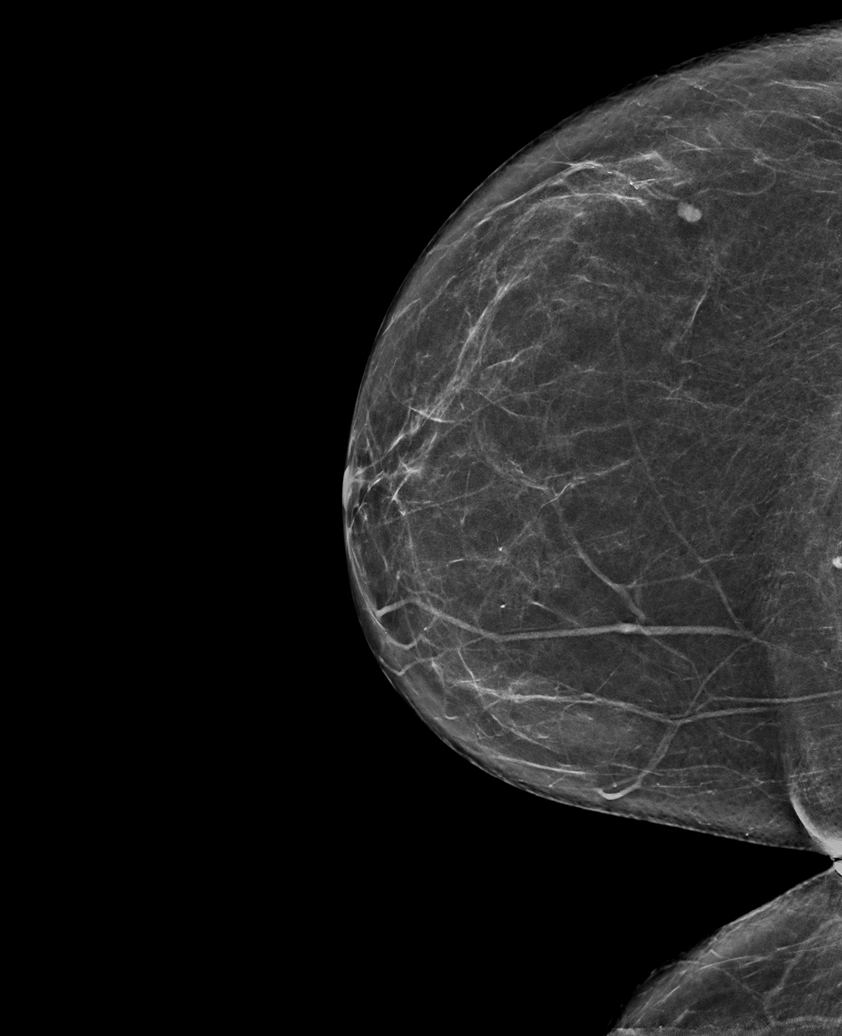

[L MLO synth-2D]
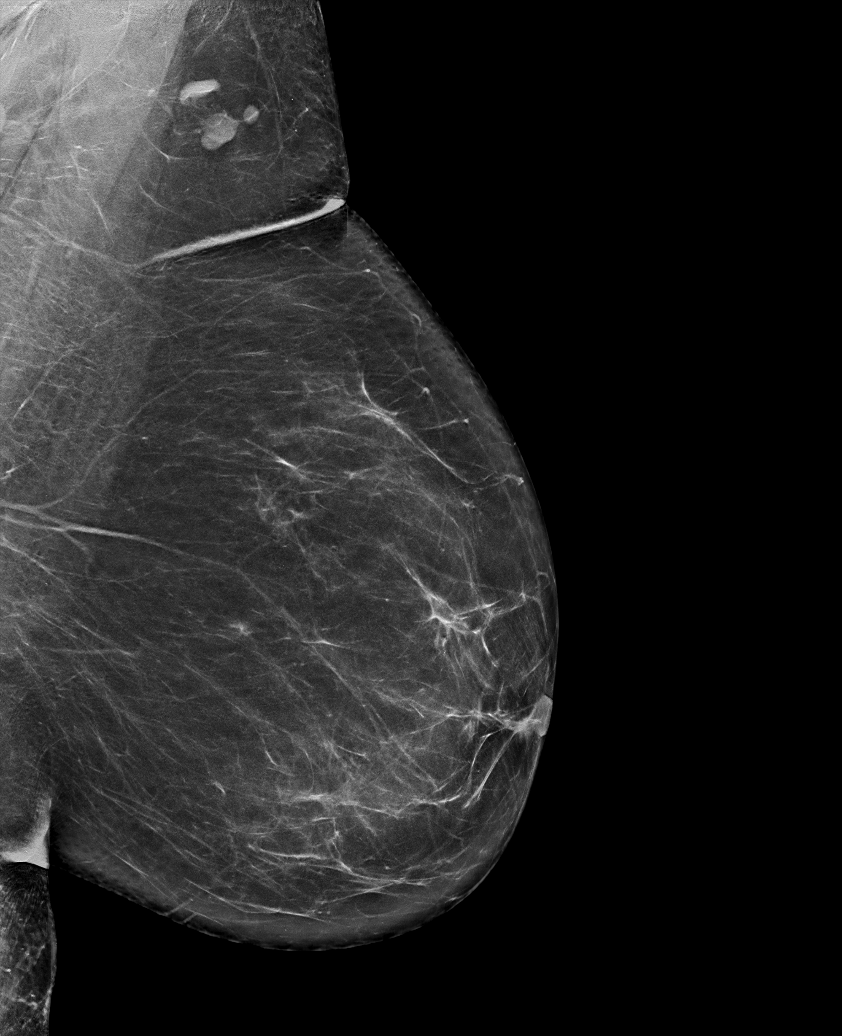

[L CC synth-2D]
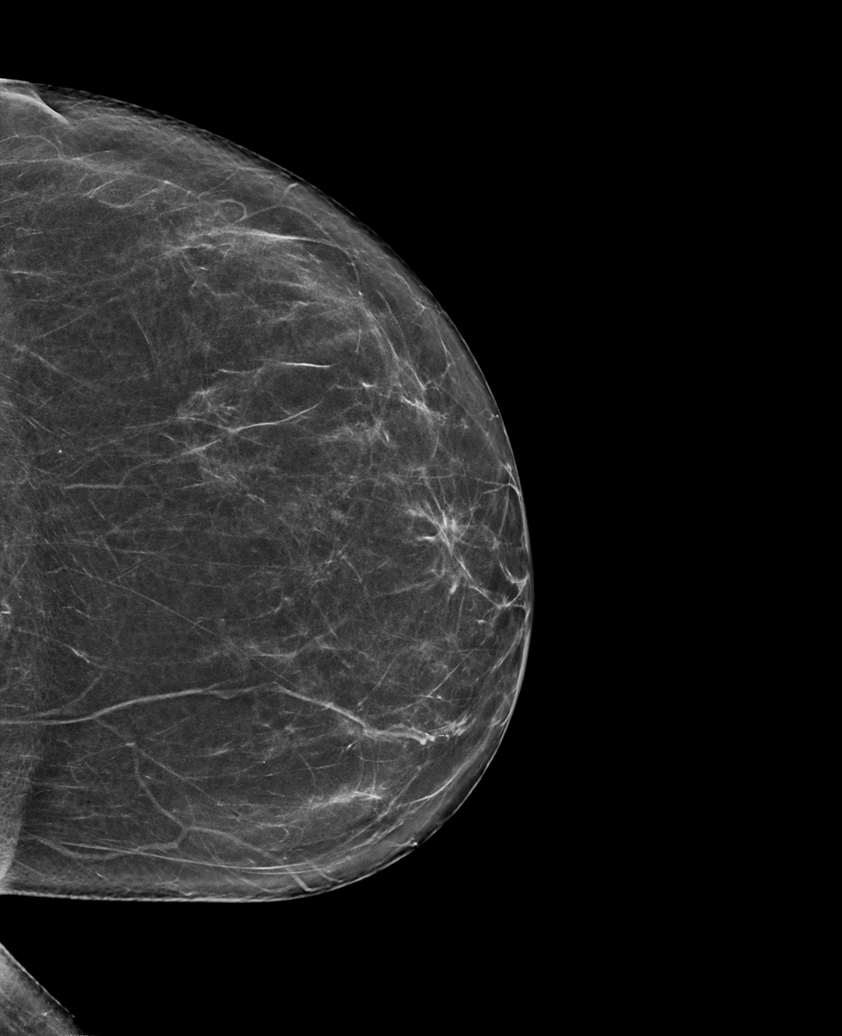

[L MLO tomo · tomo slice 39/78.0]
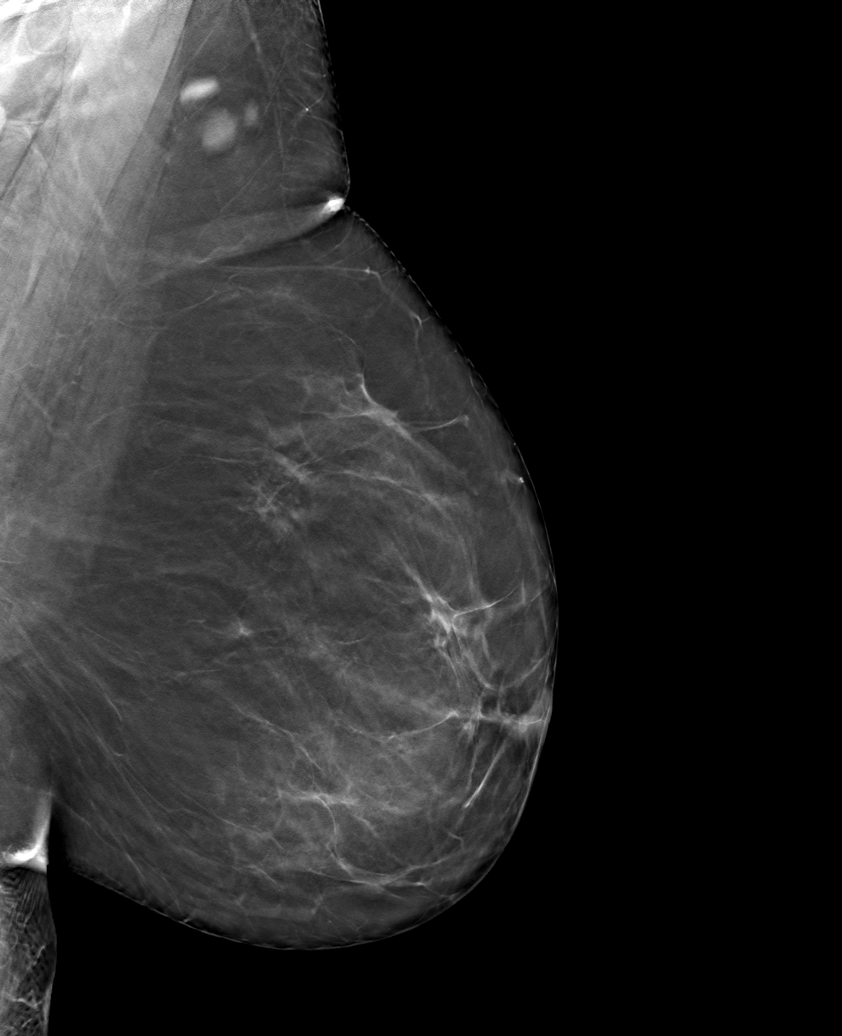

[6 of 30 positions shown; findings below may reference images not displayed]

ACR Breast Density Category b: There are scattered areas of
fibroglandular density.
FINDINGS: There are no findings suspicious for malignancy.
IMPRESSION: No mammographic evidence of malignancy. A result letter of this
screening mammogram will be mailed directly to the patient.

RECOMMENDATION:
Screening mammogram in one year. (Code:51-O-LD2)

BI-RADS CATEGORY  1: Negative.

## 2024-05-04 ENCOUNTER — Other Ambulatory Visit: Payer: Self-pay | Admitting: Internal Medicine

## 2024-05-04 DIAGNOSIS — Z1231 Encounter for screening mammogram for malignant neoplasm of breast: Secondary | ICD-10-CM

## 2024-05-19 ENCOUNTER — Ambulatory Visit
Admission: RE | Admit: 2024-05-19 | Discharge: 2024-05-19 | Disposition: A | Discharge status: Home / Self Care | Source: Ambulatory Visit | Attending: Internal Medicine | Admitting: Internal Medicine

## 2024-05-19 DIAGNOSIS — Z1231 Encounter for screening mammogram for malignant neoplasm of breast: Secondary | ICD-10-CM | POA: Diagnosis present

## 2025-01-20 ENCOUNTER — Other Ambulatory Visit: Payer: Self-pay

## 2025-01-20 ENCOUNTER — Encounter: Payer: Self-pay | Admitting: *Deleted

## 2025-01-20 ENCOUNTER — Emergency Department: Payer: Medicare (Managed Care)

## 2025-01-20 ENCOUNTER — Emergency Department
Admission: EM | Admit: 2025-01-20 | Discharge: 2025-01-20 | Disposition: A | Discharge status: Home / Self Care | Payer: Medicare (Managed Care) | Attending: Emergency Medicine | Admitting: Emergency Medicine

## 2025-01-20 DIAGNOSIS — I10 Essential (primary) hypertension: Secondary | ICD-10-CM | POA: Insufficient documentation

## 2025-01-20 DIAGNOSIS — W19XXXA Unspecified fall, initial encounter: Secondary | ICD-10-CM

## 2025-01-20 DIAGNOSIS — S0003XA Contusion of scalp, initial encounter: Secondary | ICD-10-CM | POA: Insufficient documentation

## 2025-01-20 DIAGNOSIS — W133XXA Fall through floor, initial encounter: Secondary | ICD-10-CM | POA: Insufficient documentation

## 2025-01-20 DIAGNOSIS — E039 Hypothyroidism, unspecified: Secondary | ICD-10-CM | POA: Insufficient documentation

## 2025-01-20 NOTE — ED Provider Notes (Cosign Needed Addendum)
 "  [facility] Provider Note    Event Date/Time   First MD Initiated Contact with Patient 01/20/25 2009     (approximate)   History   Fall   HPI  Jillian Silva is a 69 y.o. female with PMH of hypertension, hyperlipidemia, hypothyroidism presents for evaluation after a fall this afternoon.  Patient was mopping the floor when she fell.  Patient did not feel dizzy or lightheaded before the fall. She states she did hit her head on a mini fridge.  No LOC, visual changes or vomiting.  Patient endorses pain to her left arm.  She denies headache at this time.     Physical Exam   Triage Vital Signs: ED Triage Vitals  Encounter Vitals Group     BP 01/20/25 1610 (!) 157/69     Girls Systolic BP Percentile --      Girls Diastolic BP Percentile --      Boys Systolic BP Percentile --      Boys Diastolic BP Percentile --      Pulse Rate 01/20/25 1610 65     Resp 01/20/25 1610 17     Temp 01/20/25 1610 98.1 F (36.7 C)     Temp Source 01/20/25 1610 Oral     SpO2 01/20/25 1610 100 %     Weight --      Height --      Head Circumference --      Peak Flow --      Pain Score 01/20/25 1622 8     Pain Loc --      Pain Education --      Exclude from Growth Chart --     Most recent vital signs: Vitals:   01/20/25 1610  BP: (!) 157/69  Pulse: 65  Resp: 17  Temp: 98.1 F (36.7 C)  SpO2: 100%   General: Awake, no distress.  CV:  Good peripheral perfusion.  RRR. Resp:  Normal effort.  CTAB Abd:  No distention.  Other:  PERRL, EOM intact, no ataxia on finger-nose, no pronator drift, symmetric facial movements.  Hematoma noted to the back of patient's head, no overlying abrasions or laceration.  No tenderness to palpation of the left shoulder and left arm, radial pulses 2+ and regular, range of motion well-maintained, strength 5/5 compared to the other side.   ED Results / Procedures / Treatments   Labs (all labs ordered are listed, but only abnormal results  are displayed) Labs Reviewed - No data to display   EKG  RADIOLOGY  CT head, CT cervical spine and humerus x-ray obtained see ED course for interpretation.  PROCEDURES:  Critical Care performed: No  Procedures   MEDICATIONS ORDERED IN ED: Medications - No data to display   IMPRESSION / MDM / ASSESSMENT AND PLAN / ED COURSE  I reviewed the triage vital signs and the nursing notes.                             69 year old female presents for evaluation after a fall earlier today.  Blood pressure little bit elevated but vital signs stable otherwise.  Patient NAD on exam.  Differential diagnosis includes, but is not limited to, skull fracture, intracranial bleed, hematoma, contusion, cervical spine fracture, muscle strain, humerus fracture.  Patient's presentation is most consistent with acute complicated illness / injury requiring diagnostic workup.  Physical exam and imaging are both reassuring.  Patient advised to  take Tylenol , ibuprofen, ice, heat and topical pain relievers as needed.  Discussed return precautions.  Patient voiced understanding, questions were answered and she is stable at discharge.  Clinical Course as of 01/20/25 2025  Wed Jan 20, 2025  2010 CT Cervical Spine Wo Contrast Negative for acute abnormalities.  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   [LD]  2010 DG Humerus Left Negative. [LD]  2010 CT Head Wo Contrast No acute intracranial abnormalities but does show soft tissue hematoma. [LD]    Clinical Course User Index [LD] Cleaster Tinnie LABOR, PA-C     FINAL CLINICAL IMPRESSION(S) / ED DIAGNOSES   Final diagnoses:  Fall, initial encounter  Scalp hematoma, initial encounter     Rx / DC Orders   ED Discharge Orders     None        Note:  This document was prepared using Dragon voice recognition software and may include unintentional dictation errors.   Cleaster Tinnie LABOR,  PA-C 01/20/25 2025    Cleaster Tinnie LABOR, PA-C 01/20/25 2025  "

## 2025-01-20 NOTE — ED Triage Notes (Signed)
 Pt ambulatory to triage.  Pt sent from [facility] for eval.  Pt reports mopping her floor today and fell.  No loc.  Pt has left upper arm.  Pt has hematoma to back of head.  No dizziness  denies sob or chest pain   no n/v  pt struck head on refrigerator.  No neck or back pain  pt alert  speech clear.

## 2025-01-20 NOTE — Discharge Instructions (Signed)
 The CT scan of your head and neck as well as the x-ray of your left arm did not show any broken bones or bleeds.  You do have a bump on the back of your head called a hematoma.  This will resolve on its own with time.  You can take 650 mg of Tylenol  and 600 mg of ibuprofen every 6 hours as needed for pain. You can use ice, heat, muscle creams and other topical pain relievers as well.

## 2025-01-20 NOTE — ED Triage Notes (Signed)
 First nurse note: pt to ED [facility] for fall, unknown cause, no LOC. +hit head. C/o left arm pain.
